# Patient Record
Sex: Male | Born: 1995 | Race: White | Marital: Single
Health system: Southern US, Community
[De-identification: ages and names within clinical notes are randomized; demographics above are authoritative.]

## PROBLEM LIST (undated history)

## (undated) DIAGNOSIS — F988 Other specified behavioral and emotional disorders with onset usually occurring in childhood and adolescence: Secondary | ICD-10-CM

## (undated) HISTORY — DX: Other specified behavioral and emotional disorders with onset usually occurring in childhood and adolescence: F98.8

## (undated) HISTORY — PX: CIRCUMCISION: SUR203

---
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
## 2017-04-08 DIAGNOSIS — Z79899 Other long term (current) drug therapy: Secondary | ICD-10-CM | POA: Insufficient documentation

## 2017-04-08 DIAGNOSIS — Y999 Unspecified external cause status: Secondary | ICD-10-CM | POA: Diagnosis not present

## 2017-04-08 DIAGNOSIS — S29019A Strain of muscle and tendon of unspecified wall of thorax, initial encounter: Secondary | ICD-10-CM | POA: Insufficient documentation

## 2017-04-08 DIAGNOSIS — Y92410 Unspecified street and highway as the place of occurrence of the external cause: Secondary | ICD-10-CM | POA: Diagnosis not present

## 2017-04-08 DIAGNOSIS — Y9389 Activity, other specified: Secondary | ICD-10-CM | POA: Insufficient documentation

## 2017-04-08 DIAGNOSIS — S299XXA Unspecified injury of thorax, initial encounter: Secondary | ICD-10-CM | POA: Diagnosis present

## 2017-04-09 ENCOUNTER — Emergency Department (HOSPITAL_COMMUNITY)
Admission: EM | Admit: 2017-04-09 | Discharge: 2017-04-09 | Disposition: A | Discharge status: Home / Self Care | Payer: No Typology Code available for payment source | Attending: Emergency Medicine | Admitting: Emergency Medicine

## 2017-04-09 ENCOUNTER — Emergency Department (HOSPITAL_COMMUNITY): Payer: No Typology Code available for payment source

## 2017-04-09 ENCOUNTER — Encounter (HOSPITAL_COMMUNITY): Payer: Self-pay | Admitting: Emergency Medicine

## 2017-04-09 DIAGNOSIS — S29019A Strain of muscle and tendon of unspecified wall of thorax, initial encounter: Secondary | ICD-10-CM

## 2017-04-09 NOTE — Discharge Instructions (Signed)
Apply ice to sore areas several times a day.  Take ibuprofen or naproxen as needed for pain.
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
## 2020-12-03 DIAGNOSIS — Z20828 Contact with and (suspected) exposure to other viral communicable diseases: Secondary | ICD-10-CM | POA: Diagnosis not present

## 2022-09-14 DIAGNOSIS — J019 Acute sinusitis, unspecified: Secondary | ICD-10-CM | POA: Diagnosis not present

## 2023-10-03 ENCOUNTER — Encounter (HOSPITAL_COMMUNITY): Payer: Self-pay | Admitting: Emergency Medicine

## 2024-02-01 ENCOUNTER — Ambulatory Visit (INDEPENDENT_AMBULATORY_CARE_PROVIDER_SITE_OTHER): Admitting: Otolaryngology

## 2024-02-01 ENCOUNTER — Encounter (INDEPENDENT_AMBULATORY_CARE_PROVIDER_SITE_OTHER): Payer: Self-pay | Admitting: Otolaryngology

## 2024-02-01 VITALS — BP 127/88 | HR 78 | Ht 68.0 in | Wt 145.0 lb

## 2024-02-01 DIAGNOSIS — J342 Deviated nasal septum: Secondary | ICD-10-CM

## 2024-02-01 DIAGNOSIS — H938X2 Other specified disorders of left ear: Secondary | ICD-10-CM | POA: Diagnosis not present

## 2024-02-01 DIAGNOSIS — R0981 Nasal congestion: Secondary | ICD-10-CM

## 2024-02-01 DIAGNOSIS — J3089 Other allergic rhinitis: Secondary | ICD-10-CM | POA: Diagnosis not present

## 2024-02-01 DIAGNOSIS — J343 Hypertrophy of nasal turbinates: Secondary | ICD-10-CM

## 2024-02-01 MED ORDER — LEVOCETIRIZINE DIHYDROCHLORIDE 5 MG PO TABS: 5.0000 mg | ORAL_TABLET | Freq: Every evening | ORAL | 3 refills | Status: AC

## 2024-02-01 MED ORDER — FLUTICASONE PROPIONATE 50 MCG/ACT NA SUSP: 2.0000 | Freq: Every day | NASAL | 6 refills | Status: AC
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
## 2024-02-02 ENCOUNTER — Other Ambulatory Visit (INDEPENDENT_AMBULATORY_CARE_PROVIDER_SITE_OTHER): Payer: Self-pay | Admitting: Otolaryngology

## 2024-02-02 DIAGNOSIS — H938X2 Other specified disorders of left ear: Secondary | ICD-10-CM

## 2024-02-29 ENCOUNTER — Ambulatory Visit (HOSPITAL_COMMUNITY)
Admission: RE | Admit: 2024-02-29 | Discharge: 2024-02-29 | Disposition: A | Discharge status: Home / Self Care | Source: Ambulatory Visit | Attending: Otolaryngology | Admitting: Otolaryngology

## 2024-02-29 DIAGNOSIS — H938X2 Other specified disorders of left ear: Secondary | ICD-10-CM | POA: Insufficient documentation

## 2024-02-29 MED ORDER — GADOBUTROL 1 MMOL/ML IV SOLN
6.5000 mL | Freq: Once | INTRAVENOUS | Status: AC | PRN
  Administered 2024-02-29: 6.5 mL via INTRAVENOUS

## 2024-03-14 ENCOUNTER — Telehealth (INDEPENDENT_AMBULATORY_CARE_PROVIDER_SITE_OTHER): Payer: Self-pay | Admitting: Otolaryngology

## 2024-03-14 NOTE — Telephone Encounter (Signed)
 LVM cancelling 03/21/2024 appt due to scans not done.  Patient to call back when scan is scheduled.

## 2024-03-21 ENCOUNTER — Telehealth (INDEPENDENT_AMBULATORY_CARE_PROVIDER_SITE_OTHER): Payer: Self-pay | Admitting: Otolaryngology

## 2024-03-21 ENCOUNTER — Ambulatory Visit (INDEPENDENT_AMBULATORY_CARE_PROVIDER_SITE_OTHER): Admitting: Otolaryngology

## 2024-03-21 ENCOUNTER — Encounter (INDEPENDENT_AMBULATORY_CARE_PROVIDER_SITE_OTHER): Payer: Self-pay | Admitting: Otolaryngology

## 2024-03-21 DIAGNOSIS — H938X2 Other specified disorders of left ear: Secondary | ICD-10-CM

## 2024-03-21 NOTE — Telephone Encounter (Signed)
 Called and left a message for the patient to make an appointment to review the MR face scan and discuss surgery.
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
## 2024-04-02 ENCOUNTER — Other Ambulatory Visit (INDEPENDENT_AMBULATORY_CARE_PROVIDER_SITE_OTHER): Payer: Self-pay | Admitting: Otolaryngology

## 2024-04-02 MED ORDER — ACETAMINOPHEN 500 MG PO TABS: 500.0000 mg | ORAL_TABLET | Freq: Four times a day (QID) | ORAL | 1 refills | Status: AC | PRN

## 2024-04-02 MED ORDER — OXYCODONE HCL 5 MG PO TABS: 5.0000 mg | ORAL_TABLET | Freq: Four times a day (QID) | ORAL | 0 refills | Status: AC | PRN

## 2024-04-02 MED ORDER — AMOXICILLIN-POT CLAVULANATE 875-125 MG PO TABS
1.0000 | ORAL_TABLET | Freq: Two times a day (BID) | ORAL | 0 refills | Status: DC
Start: 2024-04-02 — End: 2024-07-02

## 2024-04-02 MED ORDER — IBUPROFEN 600 MG PO TABS
600.0000 mg | ORAL_TABLET | Freq: Four times a day (QID) | ORAL | 0 refills | Status: AC
Start: 2024-04-02 — End: ?

## 2024-04-02 NOTE — Progress Notes (Signed)
 Rx for post-op recovery surgery tomorrow

## 2024-04-03 DIAGNOSIS — L723 Sebaceous cyst: Secondary | ICD-10-CM | POA: Diagnosis not present

## 2024-04-04 ENCOUNTER — Telehealth (INDEPENDENT_AMBULATORY_CARE_PROVIDER_SITE_OTHER): Payer: Self-pay

## 2024-04-04 ENCOUNTER — Other Ambulatory Visit (INDEPENDENT_AMBULATORY_CARE_PROVIDER_SITE_OTHER): Payer: Self-pay | Admitting: Otolaryngology

## 2024-04-04 MED ORDER — TRAMADOL HCL 50 MG PO TABS: 50.0000 mg | ORAL_TABLET | Freq: Four times a day (QID) | ORAL | 0 refills | Status: AC | PRN

## 2024-04-04 NOTE — Telephone Encounter (Signed)
 Patient's mother called to discuss medication change. Patient was having a sensitive stomach while taking Oxy so Dr. Okey changed the medication to Tramadol . Patient's mother called back again to clarify if it was okay for the patient to take Adderall with the Tramadol . Per Dr. Soldatova, they should consult with the pharmacist when they pick up the medication. Patient's mother understood. I told her to call us  back if they need anything else.

## 2024-04-18 ENCOUNTER — Encounter (INDEPENDENT_AMBULATORY_CARE_PROVIDER_SITE_OTHER): Payer: Self-pay | Admitting: Otolaryngology

## 2024-04-18 ENCOUNTER — Ambulatory Visit (INDEPENDENT_AMBULATORY_CARE_PROVIDER_SITE_OTHER): Admitting: Otolaryngology

## 2024-04-18 VITALS — BP 118/79 | HR 89

## 2024-04-18 DIAGNOSIS — Z9889 Other specified postprocedural states: Secondary | ICD-10-CM

## 2024-04-18 DIAGNOSIS — H938X2 Other specified disorders of left ear: Secondary | ICD-10-CM

## 2024-04-18 NOTE — Progress Notes (Signed)
 ENT Progress Note   S/p Epidermal Inclusion cyst removal in left post-auricular area. No issues with healing, had one suture pop and minimal drainage.   PE: 5-0 fast gut simple interrupted suture placed along post-auricular incision where suture came out, crusting removed, no fluctuance, no erythema wound appears to be healing well    A/P S/p left post-auricular mass excision doing well   - continue to apply Vaseline to help with crusting   RTC if there are issues with wound healing

## 2024-05-09 ENCOUNTER — Encounter: Payer: Self-pay | Admitting: Otolaryngology

## 2024-06-27 ENCOUNTER — Other Ambulatory Visit (HOSPITAL_COMMUNITY): Admission: RE | Admit: 2024-06-27 | Discharge: 2024-06-27 | Disposition: A | Discharge status: Home / Self Care

## 2024-06-27 ENCOUNTER — Ambulatory Visit (INDEPENDENT_AMBULATORY_CARE_PROVIDER_SITE_OTHER)

## 2024-06-27 ENCOUNTER — Encounter (INDEPENDENT_AMBULATORY_CARE_PROVIDER_SITE_OTHER): Payer: Self-pay

## 2024-06-27 VITALS — BP 108/73 | HR 78 | Temp 97.9°F | Ht 68.0 in | Wt 145.0 lb

## 2024-06-27 DIAGNOSIS — J343 Hypertrophy of nasal turbinates: Secondary | ICD-10-CM

## 2024-06-27 DIAGNOSIS — J342 Deviated nasal septum: Secondary | ICD-10-CM

## 2024-06-27 DIAGNOSIS — J329 Chronic sinusitis, unspecified: Secondary | ICD-10-CM

## 2024-06-27 DIAGNOSIS — L988 Other specified disorders of the skin and subcutaneous tissue: Secondary | ICD-10-CM | POA: Diagnosis present

## 2024-06-27 DIAGNOSIS — Z9889 Other specified postprocedural states: Secondary | ICD-10-CM

## 2024-06-27 DIAGNOSIS — J3489 Other specified disorders of nose and nasal sinuses: Secondary | ICD-10-CM

## 2024-06-27 NOTE — Progress Notes (Signed)
 Dear Dr. Loring, Here is my assessment for our mutual patient, Austin Garrett. Thank you for allowing me the opportunity to care for your patient. Please do not hesitate to contact me should you have any other questions. Sincerely, Dr. Hadassah Parody  Otolaryngology Clinic Note Referring provider: Dr. Loring HPI:   (06/27/2024) Austin Garrett is a 28 y.o. male kindly referred by Dr. Loring for evaluation of left ear drainage after removal of epidermal inclusion cyst as well as nasal obstruction and facial pain.   Prior pt of Dr. Soldatova.   Initially seen 02/01/24 for mass behind left ear. Underwent MRI and later excision on 04/06/24 and path showed epidermal inclusion cyst. Post-op needed another suture to be placed in clinic given slight wound dehiscence.   Shortly after his surgery noticed that he would get a squishing sound near his ear when showering and noticed the ear to be draining. Some pain.   He also notes that he has previously been told he has a deviated septum and notes that he has bothersome nasal congestion as well as facial pain. Mostly right sided. Additionally has facial pain and pressure mostly on the right side with discolored nasal drainage at times.    Independent Review of Additional Tests or Records:  Path 04/06/24: epidermal inclusion cyst   PMH/Meds/All/SocHx/FamHx/ROS:   Past Medical History:  Diagnosis Date   ADD (attention deficit disorder)      Past Surgical History:  Procedure Laterality Date   CIRCUMCISION      Family History  Problem Relation Age of Onset   ADD / ADHD Mother    Migraines Mother    ADD / ADHD Sister    Depression Sister    Tremor Maternal Grandmother        Essential Tremor   Tremor Other        Essential Tremor     Social Connections: Not on file     Current Outpatient Medications  Medication Instructions   acetaminophen  (TYLENOL ) 500 mg, Oral, Every 6 hours PRN, Please take every 6 hrs and stagger with Motrin  3  hrs apart from each other. Please do not exceed maximum daily dose to avoid liver damage   amoxicillin -clavulanate (AUGMENTIN ) 875-125 MG tablet 1 tablet, Oral, 2 times daily   amphetamine-dextroamphetamine (ADDERALL XR) 10 MG 24 hr capsule 10 mg, Daily   fluticasone  (FLONASE ) 50 MCG/ACT nasal spray 2 sprays, Each Nare, Daily   ibuprofen  (ADVIL ) 600 mg, Oral, Every 6 hours, Please take every 6 hrs, and stagger this medication 3 hrs apart from Tylenol    levocetirizine (XYZAL  ALLERGY 24HR) 5 mg, Oral, Every evening   loratadine (CLARITIN) 10 mg, Daily PRN   MELATONIN PO 1 tablet, At bedtime PRN   oxyCODONE  (ROXICODONE ) 5 mg, Oral, Every 6 hours PRN, Crush and take with yogurt if painful to swallow pills     Physical Exam:   BP 108/73 (BP Location: Right Arm, Patient Position: Sitting, Cuff Size: Normal)   Pulse 78   Temp 97.9 F (36.6 C)   Ht 5' 8 (1.727 m)   Wt 145 lb (65.8 kg)   SpO2 97%   BMI 22.05 kg/m   Salient findings:  CN II-XII intact Left post-auricular incision with crusting, mostly healed except superior most aspect of root of helix there is a dehiscent area with purulence expressible. Mildly tender.  Anterior rhinoscopy: Septum deviated to the right and to the left posteriorly (S-shaped); bilateral inferior turbinates with hypertrophy No lesions of oral cavity/oropharynx No obviously  palpable neck masses/lymphadenopathy/thyromegaly No respiratory distress or stridor  Seprately Identifiable Procedures:  Prior to initiating any procedures, risks/benefits/alternatives were explained to the patient and verbal consent obtained. None  Impression & Plans:  Austin Garrett is a 28 y.o. male with   1. Fistula of skin   2. Chronic sinusitis, unspecified location   3. Nasal obstruction   4. Nasal septal deviation   5. 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 The skin that has healed here is unhealthy and would not heal with simply a stitch placement. Will require small debridement and removal of unhealthy tissue and then closure.  - Obtain wound culture to identify causative organism. - Prescribe antibiotics based on culture results. - Schedule surgical excision of unhealthy skin and wound closure  Nasal obstruction Nasal septal deviation Inferior turbinate hypertrophy Facial pressure and pain  - Nasal sprays not helpful. Discussed septoplasty at the time of repair of the ear and he would be interested in this.  - Recommend CT sinus to ensure no accompanying sinus disease given facial pain associated with nasal congestion    Follow-up with sinus CT. At that time will discuss for nasal septum and sinus disease.  We did discuss debridement and closure of ear wound. Discussed risks including pain, bleeding, infection, need for repeat procedures, poor wound healing. He understands and would like to proceed. Will wait to post case until we decide if intervention needed on sinuses; likely will need septoplasty and ITR.    See below regarding exact medications prescribed this encounter including dosages and route: No orders of the defined types were placed in this encounter.   Thank you for allowing me the opportunity to care for your patient. Please do not hesitate to contact me should you have any other questions.  Sincerely, Hadassah Parody, MD Otolaryngologist (ENT), [facility] Phone: [phone] Fax: [phone]

## 2024-06-27 NOTE — Patient Instructions (Signed)
 I have ordered an imaging study for you to complete prior to your next visit. Please call [facility] Scheduling at [phone] to schedule your imaging if you have not received a call within 24 hours. If you are unable to complete your imaging study prior to your next scheduled visit please call our office to let us  know.

## 2024-07-01 ENCOUNTER — Ambulatory Visit (INDEPENDENT_AMBULATORY_CARE_PROVIDER_SITE_OTHER): Payer: Self-pay

## 2024-07-02 ENCOUNTER — Other Ambulatory Visit (INDEPENDENT_AMBULATORY_CARE_PROVIDER_SITE_OTHER): Payer: Self-pay

## 2024-07-02 LAB — AEROBIC/ANAEROBIC CULTURE W GRAM STAIN (SURGICAL/DEEP WOUND): Culture: NORMAL

## 2024-07-02 MED ORDER — AMOXICILLIN 875 MG PO TABS: 875.0000 mg | ORAL_TABLET | Freq: Two times a day (BID) | ORAL | 0 refills | Status: AC

## 2024-07-02 MED ORDER — AMOXICILLIN 875 MG PO TABS: 875.0000 mg | ORAL_TABLET | Freq: Two times a day (BID) | ORAL | 0 refills | Status: DC

## 2024-07-02 NOTE — Telephone Encounter (Signed)
 Called Pt let him know he wants the prescription sent to [facility].

## 2024-07-04 ENCOUNTER — Ambulatory Visit (HOSPITAL_COMMUNITY)
Admission: RE | Admit: 2024-07-04 | Discharge: 2024-07-04 | Disposition: A | Discharge status: Home / Self Care | Source: Ambulatory Visit

## 2024-07-04 DIAGNOSIS — R519 Headache, unspecified: Secondary | ICD-10-CM | POA: Diagnosis not present

## 2024-07-04 DIAGNOSIS — J329 Chronic sinusitis, unspecified: Secondary | ICD-10-CM | POA: Insufficient documentation

## 2024-07-18 ENCOUNTER — Telehealth (INDEPENDENT_AMBULATORY_CARE_PROVIDER_SITE_OTHER): Payer: Self-pay

## 2024-07-18 NOTE — Telephone Encounter (Signed)
 I spoke with patient today as patient had been spoken to previously to move his scheduled appointment with Dr. Greggory that was scheduled on Friday, July 20, 2024 as she will be in surgery that afternoon when the appointment was set.  Patient had stated to [facility] he wanted to switch to Dr. Tobie as his original referral was in fact placed for him to see Dr. Tobie.  Patient was incorrectly scheduled with Dr. Soldatova for his initial consult.  He did see her, she performed surgery on his ear and Dr. Greggory saw the patient for a follow up appointment.  Patient has no issues seeing Dr. Greggory, but stated since she thinks he will need a second surgery to repair a small hole in the surgical scar, he would like for the original doctor on the referral to do that for him.    I discussed all of this with Dr. Tobie and Olam today.  This is a courtesy message being sent to both providers as we typically do not do a switch between providers in this practice but since this was an original scheduling error on our part and not provider related as far as care issues, we will accommodate this.  Patient, again, was very kind and thankful for Dr. Cinthia care and is looking forward to being seen by Dr. Tobie.  Patient has a consult scheduled with Dr. Tobie on 08/08/2024.

## 2024-07-20 ENCOUNTER — Ambulatory Visit (INDEPENDENT_AMBULATORY_CARE_PROVIDER_SITE_OTHER)

## 2024-08-08 ENCOUNTER — Encounter (INDEPENDENT_AMBULATORY_CARE_PROVIDER_SITE_OTHER): Payer: Self-pay | Admitting: Otolaryngology

## 2024-08-08 ENCOUNTER — Ambulatory Visit (INDEPENDENT_AMBULATORY_CARE_PROVIDER_SITE_OTHER): Admitting: Otolaryngology

## 2024-08-08 VITALS — BP 134/100 | HR 106 | Ht 68.0 in | Wt 150.0 lb

## 2024-08-08 DIAGNOSIS — H938X2 Other specified disorders of left ear: Secondary | ICD-10-CM

## 2024-08-08 DIAGNOSIS — H70812 Postauricular fistula, left ear: Secondary | ICD-10-CM

## 2024-08-08 DIAGNOSIS — R0981 Nasal congestion: Secondary | ICD-10-CM

## 2024-08-08 DIAGNOSIS — L988 Other specified disorders of the skin and subcutaneous tissue: Secondary | ICD-10-CM

## 2024-08-08 DIAGNOSIS — J343 Hypertrophy of nasal turbinates: Secondary | ICD-10-CM

## 2024-08-08 DIAGNOSIS — S01302S Unspecified open wound of left ear, sequela: Secondary | ICD-10-CM

## 2024-08-08 DIAGNOSIS — J342 Deviated nasal septum: Secondary | ICD-10-CM

## 2024-08-08 DIAGNOSIS — J3489 Other specified disorders of nose and nasal sinuses: Secondary | ICD-10-CM

## 2024-08-08 MED ORDER — SALINE SPRAY 0.65 % NA SOLN: 2.0000 | Freq: Two times a day (BID) | NASAL | 5 refills | Status: AC

## 2024-08-08 NOTE — Patient Instructions (Signed)
 Septoplasty and bilateral inferior turbinate reduction and concha bullosa.

## 2024-08-08 NOTE — Progress Notes (Unsigned)
 Dear Dr. Loring, Here is my assessment for our mutual patient, Austin Garrett. Thank you for allowing me the opportunity to care for your patient. Please do not hesitate to contact me should you have any other questions. Sincerely, Dr. Eldora Blanch  Otolaryngology Clinic Note  HISTORY: Austin Garrett is a 28 y.o. male with history of *** kindly referred by Dr. Loring for evaluation of ***.   He reports ***.  Current symptoms include ***.  He denies ***.  Symptoms began *** ago.  Symptom severity is ***.  Improvement occurred with ***.  Additional evaluation has included ***.  Allergy testing *** been done.  *** No previous sinonasal surgery.  No frequent sinus infections --- once a year with typical CRS sx. Abx/steroids do help here.   Light sensitive headaches. 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 Tried nasal sprays (flonase ). Tried rinses, drowned myself Has not tried breathe right strips.  No infection over ear  On hydroxyzine for allergies  GLP-1: no AP/AC: no  Tobacco: vape   PMHx: Allergies  RADIOGRAPHIC EVALUATION AND INDEPENDENT REVIEW OF OTHER RECORDS:: ***  Past Medical History:  Diagnosis Date   ADD (attention deficit disorder)    Past Surgical History:  Procedure Laterality Date   CIRCUMCISION     Family History  Problem Relation Age of Onset   ADD / ADHD Mother    Migraines Mother    ADD / ADHD Sister    Depression Sister    Tremor Maternal Grandmother        Essential Tremor   Tremor Other        Essential Tremor   Social History   Tobacco Use   Smoking status: Never   Smokeless tobacco: Never  Substance Use Topics   Alcohol use: No   Allergies  Allergen Reactions   Codeine Nausea And Vomiting   Other     Seasonal Allergies    Current Outpatient Medications  Medication Sig Dispense Refill   acetaminophen  (TYLENOL ) 500 MG tablet Take 1 tablet (500 mg total) by mouth  every 6 (six) hours as needed. Please take every 6 hrs and stagger with Motrin  3 hrs apart from each other. Please do not exceed maximum daily dose to avoid liver damage 30 tablet 1   amphetamine-dextroamphetamine (ADDERALL XR) 10 MG 24 hr capsule Take 10 mg by mouth daily.     fluticasone  (FLONASE ) 50 MCG/ACT nasal spray Place 2 sprays into both nostrils daily. 16 g 6   ibuprofen  (ADVIL ) 600 MG tablet Take 1 tablet (600 mg total) by mouth every 6 (six) hours. Please take every 6 hrs, and stagger this medication 3 hrs apart from Tylenol  30 tablet 0   levocetirizine (XYZAL  ALLERGY 24HR) 5 MG tablet Take 1 tablet (5 mg total) by mouth every evening. 30 tablet 3   loratadine (CLARITIN) 10 MG tablet Take 10 mg by mouth daily as needed for allergies.     oxyCODONE  (ROXICODONE ) 5 MG immediate release tablet Take 1 tablet (5 mg total) by mouth every 6 (six) hours as needed for severe pain (pain score 7-10) or breakthrough pain. Crush and take with yogurt if painful to swallow pills 15 tablet 0   MELATONIN PO Take 1 tablet by mouth at bedtime as needed. (Patient not taking: Reported on 08/08/2024)     No current facility-administered medications for this visit.   BP (!) 134/100 (BP Location: Left Arm, Patient Position: Sitting, Cuff Size: Normal)  Pulse (!) 106   Ht 5' 8 (1.727 m)   Wt 150 lb (68 kg)   SpO2 96%   BMI 22.81 kg/m   PHYSICAL EXAM:  BP (!) 134/100 (BP Location: Left Arm, Patient Position: Sitting, Cuff Size: Normal)   Pulse (!) 106   Ht 5' 8 (1.727 m)   Wt 150 lb (68 kg)   SpO2 96%   BMI 22.81 kg/m    Salient findings:  CN II-XII intact *** Bilateral EAC clear and TM intact with well pneumatized middle ear spaces Weber 512: Rinne 512: AC > BC b/l *** Rine 1024: AC > BC b/l *** Nose: Anterior rhinoscopy reveals ***.  Nasal endoscopy was indicated to better evaluate the nose and paranasal sinuses, given the patient's history and exam findings, and is detailed below. No  lesions of oral cavity/oropharynx; dentition *** No obviously palpable neck masses/lymphadenopathy/thyromegaly No respiratory distress or stridor***   PROCEDURE:  Prior to initiating any procedures, risks/benefits/alternatives were explained to the patient and verbal consent obtained. Diagnostic Nasal Endoscopy Pre-procedure diagnosis: Concern for *** Post-procedure diagnosis: same Indication: See pre-procedure diagnosis and physical exam above Complications: None apparent EBL: *** mL Anesthesia: Lidocaine 4% and topical decongestant was topically sprayed in each nasal cavity  Description of Procedure:  Patient was identified. A rigid 30 degree endoscope was utilized to evaluate the sinonasal cavities, mucosa, sinus ostia and turbinates and septum.  Overall, signs of mucosal inflammation are*** noted.  Also noted are ***.  No mucopurulence, polyps, or masses noted.   Right Middle meatus: *** Right SE Recess: *** Left MM: *** Left SE Recess: *** Left septal deviation   CPT CODE -- 31231 - Mod 25   ASSESSMENT:  ***  No diagnosis found.   PLAN: We've discussed issues and options today.  We reviewed the nasal endoscopy images together.  The risks, benefits and alternatives were discussed and questions answered.  He has elected to proceed with:  1) 2) Follow-up in *** -- sooner as necessary.  See below regarding exact medications prescribed this encounter including dosages and route: No orders of the defined types were placed in this encounter.    Thank you for allowing me the opportunity to care for your patient. Please do not hesitate to contact me should you have any other questions.  Sincerely, Eldora Blanch, MD Otolaryngologist (ENT), [facility] Phone: [phone] Fax: [phone]  MDM:  Level ***: 99*** Complexity/Problems addressed: *** Data complexity: *** independent interpretation of *** - Morbidity: ***  - Prescription Drug prescribed or  managed: ***  08/08/2024, 11:54 AM
